# Patient Record
Sex: Male | Born: 2000 | Race: White | Hispanic: No | Marital: Single | Smoking: Never smoker
Health system: Southern US, Community
[De-identification: ages and names within clinical notes are randomized; demographics above are authoritative.]

## PROBLEM LIST (undated history)

## (undated) DIAGNOSIS — J329 Chronic sinusitis, unspecified: Principal | ICD-10-CM

## (undated) DIAGNOSIS — T7840XA Allergy, unspecified, initial encounter: Secondary | ICD-10-CM

## (undated) HISTORY — DX: Allergy, unspecified, initial encounter: T78.40XA

## (undated) HISTORY — DX: Chronic sinusitis, unspecified: J32.9

---
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
## 2023-02-11 ENCOUNTER — Ambulatory Visit: Payer: Self-pay | Admitting: Family Medicine
